# Patient Record
Sex: Male | Born: 1975 | Race: Black or African American | Hispanic: No | Marital: Single | Smoking: Current some day smoker
Health system: Southern US, Community
[De-identification: ages and names within clinical notes are randomized; demographics above are authoritative.]

---

## 2009-08-07 ENCOUNTER — Emergency Department (HOSPITAL_BASED_OUTPATIENT_CLINIC_OR_DEPARTMENT_OTHER): Admission: EM | Admit: 2009-08-07 | Discharge: 2009-08-08 | Payer: Self-pay | Admitting: Emergency Medicine

## 2015-08-17 ENCOUNTER — Emergency Department (HOSPITAL_BASED_OUTPATIENT_CLINIC_OR_DEPARTMENT_OTHER)
Admission: EM | Admit: 2015-08-17 | Discharge: 2015-08-17 | Disposition: A | Discharge status: Home / Self Care | Payer: Managed Care, Other (non HMO) | Attending: Emergency Medicine | Admitting: Emergency Medicine

## 2015-08-17 ENCOUNTER — Encounter (HOSPITAL_BASED_OUTPATIENT_CLINIC_OR_DEPARTMENT_OTHER): Payer: Self-pay

## 2015-08-17 DIAGNOSIS — F17200 Nicotine dependence, unspecified, uncomplicated: Secondary | ICD-10-CM | POA: Insufficient documentation

## 2015-08-17 DIAGNOSIS — B349 Viral infection, unspecified: Secondary | ICD-10-CM | POA: Diagnosis not present

## 2015-08-17 DIAGNOSIS — R52 Pain, unspecified: Secondary | ICD-10-CM | POA: Diagnosis present

## 2015-08-17 MED ORDER — IBUPROFEN 800 MG PO TABS
800.0000 mg | ORAL_TABLET | Freq: Once | ORAL | Status: AC
  Administered 2015-08-17: 800 mg via ORAL
  Filled 2015-08-17: qty 1

## 2015-08-17 NOTE — Discharge Instructions (Signed)
Please read and follow all provided instructions.  Your diagnoses today include:  1. Viral syndrome    You appear to have an upper respiratory infection (URI). An upper respiratory tract infection, or cold, is a viral infection of the air passages leading to the lungs. It should improve gradually after 5-7 days. You may have a lingering cough that lasts for 2- 4 weeks after the infection.  Tests performed today include:  Vital signs. See below for your results today.   Medications prescribed:   Take any prescribed medications only as directed. Treatment for your infection is aimed at treating the symptoms. There are no medications, such as antibiotics, that will cure your infection.   Home care instructions:  Follow any educational materials contained in this packet.   Your illness is contagious and can be spread to others, especially during the first 3 or 4 days. It cannot be cured by antibiotics or other medicines. Take basic precautions such as washing your hands often, covering your mouth when you cough or sneeze, and avoiding public places where you could spread your illness to others.   Please continue drinking plenty of fluids.  Use over-the-counter medicines as needed as directed on packaging for symptom relief.  You may also use ibuprofen or tylenol as directed on packaging for pain or fever.  Do not take multiple medicines containing Tylenol or acetaminophen to avoid taking too much of this medication.  Follow-up instructions: Please follow-up with your primary care provider in the next 3 days for further evaluation of your symptoms if you are not feeling better.   Return instructions:   Please return to the Emergency Department if you experience worsening symptoms.   RETURN IMMEDIATELY IF you develop shortness of breath, confusion or altered mental status, a new rash, become dizzy, faint, or poorly responsive, or are unable to be cared for at home.  Please return if you have  persistent vomiting and cannot keep down fluids or develop a fever that is not controlled by tylenol or motrin.    Please return if you have any other emergent concerns.  Additional Information:  Your vital signs today were: BP 128/72 mmHg   Pulse 98   Temp(Src) 100.9 F (38.3 C) (Oral)   Resp 16   Ht 5\' 11"  (1.803 m)   Wt 90.719 kg   BMI 27.91 kg/m2   SpO2 99% If your blood pressure (BP) was elevated above 135/85 this visit, please have this repeated by your doctor within one month. --------------

## 2015-08-17 NOTE — ED Notes (Signed)
C/o body aches HA since yesterday

## 2015-08-17 NOTE — ED Provider Notes (Signed)
CSN: [id number]     Arrival date & time 08/17/15  1441 History   First MD Initiated Contact with Patient 08/17/15 1721     Chief Complaint  Patient presents with  . Generalized Body Aches   (Consider location/radiation/quality/duration/timing/severity/associated sxs/prior Treatment) HPI 40 y.o. male presents to the Emergency Department today complaining of URI symptoms since yesterday. Associated fever, non productive cough, generalized body aches. No congestion. No CP/SOB/ABD pain. No ear aches. No rhinorrhea. No N/V/D. No pain noted. Positive sick contacts due to son having flu. Tried OTC home remedies with little relief. No other symptoms noted.  History reviewed. No pertinent past medical history. History reviewed. No pertinent past surgical history. No family history on file. Social History  Substance Use Topics  . Smoking status: Current Every Day Smoker  . Smokeless tobacco: None  . Alcohol Use: No    Review of Systems ROS reviewed and all are negative for acute change except as noted in the HPI.  Allergies  Review of patient's allergies indicates no known allergies.  Home Medications   Prior to Admission medications   Not on File   BP 128/72 mmHg  Pulse 98  Temp(Src) 100.9 F (38.3 C) (Oral)  Resp 16  Ht  (1.803 m)  Wt 90.719 kg  BMI 27.91 kg/m2  SpO2 99%   Physical Exam  Constitutional: He is oriented to person, place, and time. He appears well-developed and well-nourished.  HENT:  Head: Normocephalic and atraumatic.  Right Ear: Tympanic membrane, external ear and ear canal normal.  Left Ear: Tympanic membrane, external ear and ear canal normal.  Nose: Nose normal.  Mouth/Throat: Uvula is midline, oropharynx is clear and moist and mucous membranes are normal.  Eyes: EOM are normal.  Neck: Normal range of motion. Neck supple. No tracheal deviation present.  Cardiovascular: Normal rate, regular rhythm and normal heart sounds.   Pulmonary/Chest: Effort  normal and breath sounds normal. No respiratory distress. He has no wheezes. He has no rales. He exhibits no tenderness.  Abdominal: Soft. There is no tenderness.  Musculoskeletal: Normal range of motion.  Neurological: He is alert and oriented to person, place, and time.  Skin: Skin is warm and dry.  Psychiatric: He has a normal mood and affect. His behavior is normal. Thought content normal.  Nursing note and vitals reviewed.  ED Course  Procedures (including critical care time) Labs Review Labs Reviewed - No data to display  Imaging Review No results found. I have personally reviewed and evaluated these images and lab results as part of my medical decision-making.   EKG Interpretation None      MDM  I have reviewed the relevant previous healthcare records. I obtained HPI from historian.  ED Course  Assessment: Pt is a 39yM presents with URI symptoms since yesterday . On exam, pt in NAD. VSS. Temp 100.9. Lungs CTA, Heart RRR. Abdomen nontender/soft. Patients symptoms are consistent with URI, likely viral etiology. Discussed that antibiotics are not indicated for viral infections. Pt will be discharged with symptomatic treatment.  Verbalizes understanding and is agreeable with plan. Pt is hemodynamically stable & in NAD prior to dc.  Disposition/Plan:  DC Home Additional Verbal discharge instructions given and discussed with patient.  Pt Instructed to f/u with PCP in the next week evaluation and treatment of symptoms. Return precautions given Pt acknowledges and agrees with plan  Supervising Physician Laurence Spates, MD   Final diagnoses:  Viral syndrome       Joselyn Glassman  Marlise EvesMohr, PA-C 08/17/15 1736  Laurence Spatesachel Morgan Little, MD 08/20/15 [phone]

## 2018-02-07 ENCOUNTER — Encounter (HOSPITAL_COMMUNITY): Payer: Self-pay

## 2018-02-07 ENCOUNTER — Emergency Department (HOSPITAL_COMMUNITY): Payer: 59

## 2018-02-07 ENCOUNTER — Emergency Department (HOSPITAL_COMMUNITY)
Admission: EM | Admit: 2018-02-07 | Discharge: 2018-02-07 | Disposition: A | Discharge status: Home / Self Care | Payer: 59 | Attending: Emergency Medicine | Admitting: Emergency Medicine

## 2018-02-07 DIAGNOSIS — F17200 Nicotine dependence, unspecified, uncomplicated: Secondary | ICD-10-CM | POA: Insufficient documentation

## 2018-02-07 DIAGNOSIS — R0789 Other chest pain: Secondary | ICD-10-CM | POA: Insufficient documentation

## 2018-02-07 MED ORDER — CYCLOBENZAPRINE HCL 10 MG PO TABS: 10.0000 mg | ORAL_TABLET | Freq: Two times a day (BID) | ORAL | 0 refills | Status: AC | PRN

## 2018-02-07 MED ORDER — NAPROXEN 500 MG PO TABS: 500.0000 mg | ORAL_TABLET | Freq: Two times a day (BID) | ORAL | 0 refills | Status: AC

## 2018-02-07 MED ORDER — ACETAMINOPHEN 325 MG PO TABS
650.0000 mg | ORAL_TABLET | Freq: Once | ORAL | Status: AC
  Administered 2018-02-07: 650 mg via ORAL
  Filled 2018-02-07: qty 2

## 2018-02-07 MED ORDER — OXYCODONE HCL 5 MG PO TABS
5.0000 mg | ORAL_TABLET | Freq: Once | ORAL | Status: AC
  Administered 2018-02-07: 5 mg via ORAL
  Filled 2018-02-07: qty 1
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
## 2018-02-07 NOTE — ED Notes (Signed)
Pt alert and oriented in NAD. Pt verbalized understanding of discharge instructions. 

## 2020-07-04 ENCOUNTER — Encounter (HOSPITAL_BASED_OUTPATIENT_CLINIC_OR_DEPARTMENT_OTHER): Payer: Self-pay

## 2020-07-04 ENCOUNTER — Emergency Department (HOSPITAL_BASED_OUTPATIENT_CLINIC_OR_DEPARTMENT_OTHER): Payer: 59

## 2020-07-04 ENCOUNTER — Other Ambulatory Visit: Payer: Self-pay

## 2020-07-04 ENCOUNTER — Emergency Department (HOSPITAL_BASED_OUTPATIENT_CLINIC_OR_DEPARTMENT_OTHER)
Admission: EM | Admit: 2020-07-04 | Discharge: 2020-07-04 | Disposition: A | Discharge status: Home / Self Care | Payer: 59 | Attending: Emergency Medicine | Admitting: Emergency Medicine

## 2020-07-04 DIAGNOSIS — U071 COVID-19: Secondary | ICD-10-CM | POA: Diagnosis not present

## 2020-07-04 DIAGNOSIS — R0602 Shortness of breath: Secondary | ICD-10-CM | POA: Diagnosis present

## 2020-07-04 DIAGNOSIS — F17210 Nicotine dependence, cigarettes, uncomplicated: Secondary | ICD-10-CM | POA: Diagnosis not present

## 2020-07-04 MED ORDER — BENZONATATE 100 MG PO CAPS
100.0000 mg | ORAL_CAPSULE | Freq: Once | ORAL | Status: AC
  Administered 2020-07-04: 100 mg via ORAL
  Filled 2020-07-04: qty 1

## 2020-07-04 MED ORDER — BENZONATATE 100 MG PO CAPS: 100.0000 mg | ORAL_CAPSULE | Freq: Three times a day (TID) | ORAL | 0 refills | Status: AC

## 2020-07-04 MED ORDER — ONDANSETRON 4 MG PO TBDP: ORAL_TABLET | ORAL | 0 refills | Status: AC

## 2020-07-04 MED ORDER — ONDANSETRON 4 MG PO TBDP
4.0000 mg | ORAL_TABLET | Freq: Once | ORAL | Status: AC
Start: 2020-07-04 — End: 2020-07-04
  Administered 2020-07-04: 4 mg via ORAL
  Filled 2020-07-04: qty 1

## 2020-07-04 NOTE — Discharge Instructions (Addendum)
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

## 2020-07-04 NOTE — ED Notes (Signed)
Ambulatory to treatment room without increase in work of breathing, speaking in full sentences without difficulty.

## 2020-07-04 NOTE — ED Provider Notes (Signed)
[facility] EMERGENCY DEPARTMENT Provider Note   CSN: [id number] Arrival date & time: 07/04/20  1548     History Chief Complaint  Patient presents with  . Shortness of Breath    COVID +    Samuel Harper is a 45 y.o. male who presented with persistent shortness of breath after COVID.  Patient was diagnosed with COVID on January 14.  Patient went to urgent care on the 19th and had persistent positive COVID test.  Patient has not been going back to work and has persistent cough.  Patient denies any fevers.  Patient states that he has poor appetite.  He states that he is done with his isolation today but he is still not feeling well.  Patient did not receive the COVID-vaccines.  Patient is otherwise healthy.  The history is provided by the patient.       History reviewed. No pertinent past medical history.  There are no problems to display for this patient.   History reviewed. No pertinent surgical history.     History reviewed. No pertinent family history.  Social History   Tobacco Use  . Smoking status: Current Some Day Smoker    Types: Cigarettes  . Smokeless tobacco: Never Used  Substance Use Topics  . Alcohol use: Yes    Comment: socailly  . Drug use: Yes    Types: Marijuana    Comment: occassional    Home Medications Prior to Admission medications   Medication Sig Start Date End Date Taking? Authorizing Provider  cyclobenzaprine (FLEXERIL) 10 MG tablet Take 1 tablet (10 mg total) by mouth 2 (two) times daily as needed for muscle spasms. 02/07/18   Sabas Sous, MD  naproxen (NAPROSYN) 500 MG tablet Take 1 tablet (500 mg total) by mouth 2 (two) times daily. 02/07/18   Sabas Sous, MD    Allergies    Patient has no known allergies.  Review of Systems   Review of Systems  Respiratory: Positive for cough.   Gastrointestinal: Positive for nausea.  All other systems reviewed and are negative.   Physical Exam Updated Vital Signs BP 96/69 (BP  Location: Right Arm)   Pulse 72   Temp 98.2 F (36.8 C) (Oral)   Resp 15   Ht 5\' 11"  (1.803 m)   Wt 89.4 kg   SpO2 97%   BMI 27.48 kg/m   Physical Exam Vitals and nursing note reviewed.  Constitutional:      Comments: Well-appearing overall  HENT:     Head: Normocephalic.     Mouth/Throat:     Mouth: Mucous membranes are moist.  Eyes:     Extraocular Movements: Extraocular movements intact.     Pupils: Pupils are equal, round, and reactive to light.  Cardiovascular:     Rate and Rhythm: Normal rate and regular rhythm.  Pulmonary:     Effort: Pulmonary effort is normal.     Breath sounds: Normal breath sounds.  Abdominal:     General: Bowel sounds are normal.     Palpations: Abdomen is soft.  Musculoskeletal:        General: Normal range of motion.     Cervical back: Normal range of motion and neck supple.  Skin:    General: Skin is warm.     Capillary Refill: Capillary refill takes less than 2 seconds.  Neurological:     General: No focal deficit present.     Mental Status: He is disoriented.  Psychiatric:  Mood and Affect: Mood normal.        Behavior: Behavior normal.     ED Results / Procedures / Treatments   Labs (all labs ordered are listed, but only abnormal results are displayed) Labs Reviewed - No data to display  EKG EKG Interpretation  Date/Time:  Sunday July 04 2020 16:05:56 EST Ventricular Rate:  78 PR Interval:  152 QRS Duration: 70 QT Interval:  316 QTC Calculation: 360 R Axis:   70 Text Interpretation: Normal sinus rhythm Nonspecific ST and T wave abnormality Abnormal ECG No previous ECGs available Confirmed by Richardean Canal [phone]) on 07/04/2020 7:40:20 PM   Radiology DG Chest Portable 1 View  Result Date: 07/04/2020 CLINICAL DATA:  Shortness of breath. COVID-19 positive on 06/26/2019. EXAM: PORTABLE CHEST 1 VIEW COMPARISON:  02/07/2018 FINDINGS: Midline trachea. Normal heart size and mediastinal contours. No pleural effusion or  pneumothorax. Clear lungs. IMPRESSION: No acute cardiopulmonary disease. Electronically Signed   By: Jeronimo Greaves M.D.   On: 07/04/2020 16:31    Procedures Procedures (including critical care time)  Medications Ordered in ED Medications - No data to display  ED Course  I have reviewed the triage vital signs and the nursing notes.  Pertinent labs & imaging results that were available during my care of the patient were reviewed by me and considered in my medical decision making (see chart for details).    MDM Rules/Calculators/A&P                         Samuel Harper is a 45 y.o. male here presenting with nausea and cough and persistent shortness of breath.  Patient is not hypoxic.  He is also not tachycardic and vitals are stable.  Patient was diagnosed with COVID about 10 days ago.  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  I do not think he has a PE right now.  We will give Zofran for nausea and Tessalon Perles for cough.  Will refer to post-COVID clinic.   Final Clinical Impression(s) / ED Diagnoses Final diagnoses:  None    Rx / DC Orders ED Discharge Orders    None       Charlynne Pander, MD 07/04/20 [phone]

## 2020-07-04 NOTE — ED Triage Notes (Addendum)
Pt tested positive for COVID on 1/14. States he has had cough and dyspnea on exertion and intermittent midsternal chest tightness. NAD during triage, SpO2 100%

## 2020-07-08 ENCOUNTER — Telehealth: Payer: Self-pay

## 2020-07-08 NOTE — Telephone Encounter (Signed)
[facility] ([phone]) called pt for HFU. LVM for return call to schedule HFU appt. 

## 2020-07-09 NOTE — Telephone Encounter (Signed)
2nd call to pt for HFU appt at [facility]. LVM.

## 2021-06-07 IMAGING — DX DG CHEST 1V PORT
1 series · 1 of 1 positions shown · non-contrast
Comparison: 02/07/2018

CLINICAL DATA: Shortness of breath. QPIEN-ZM positive on
06/26/2019.

EXAM:
PORTABLE CHEST 1 VIEW

[chest ap]
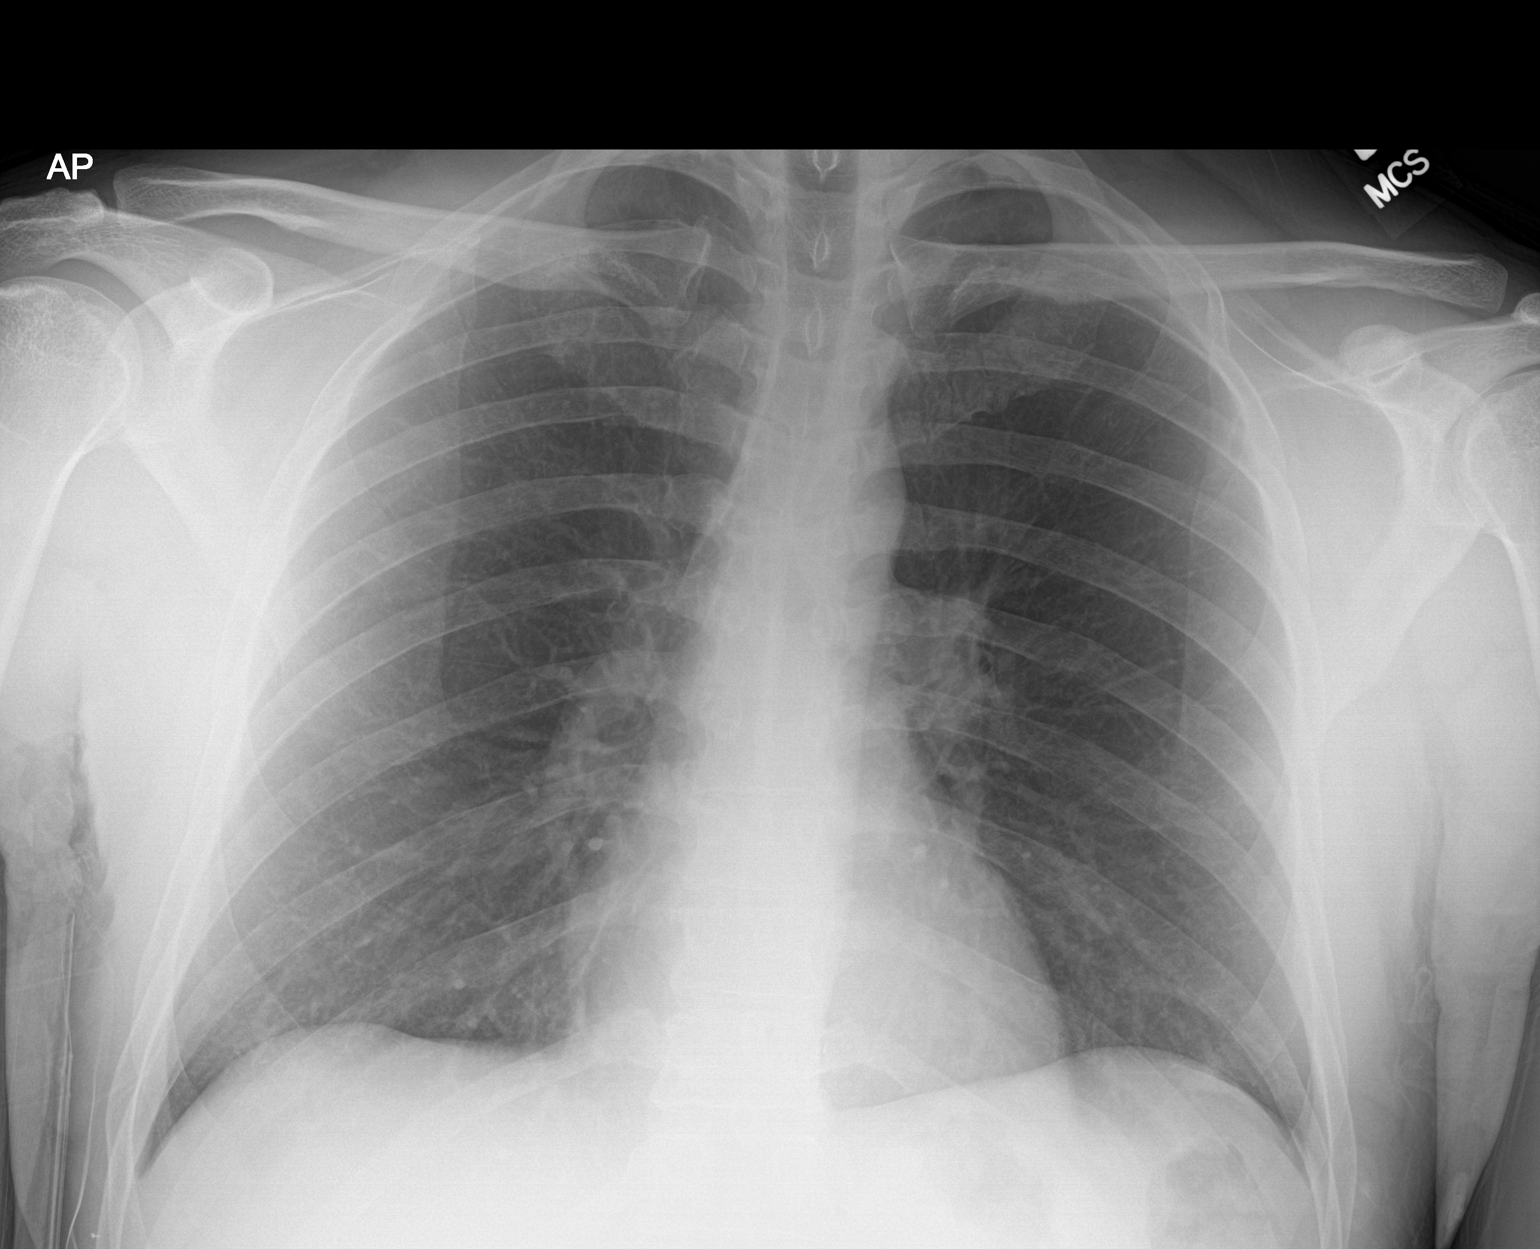

[1 of 1 positions shown; findings below may reference images not displayed]

FINDINGS: Midline trachea. Normal heart size and mediastinal contours. No
pleural effusion or pneumothorax. Clear lungs.
IMPRESSION: No acute cardiopulmonary disease.

## 2022-05-09 ENCOUNTER — Emergency Department (HOSPITAL_BASED_OUTPATIENT_CLINIC_OR_DEPARTMENT_OTHER)
Admission: EM | Admit: 2022-05-09 | Discharge: 2022-05-09 | Disposition: A | Discharge status: Home / Self Care | Payer: PRIVATE HEALTH INSURANCE | Attending: Emergency Medicine | Admitting: Emergency Medicine

## 2022-05-09 ENCOUNTER — Other Ambulatory Visit: Payer: Self-pay

## 2022-05-09 DIAGNOSIS — K0889 Other specified disorders of teeth and supporting structures: Secondary | ICD-10-CM | POA: Insufficient documentation

## 2022-05-09 MED ORDER — OXYCODONE-ACETAMINOPHEN 5-325 MG PO TABS
2.0000 | ORAL_TABLET | Freq: Once | ORAL | Status: AC
  Administered 2022-05-09: 2 via ORAL
  Filled 2022-05-09: qty 2

## 2022-05-09 MED ORDER — PENICILLIN V POTASSIUM 500 MG PO TABS: 500.0000 mg | ORAL_TABLET | Freq: Four times a day (QID) | ORAL | 0 refills | Status: AC

## 2022-05-09 MED ORDER — MELOXICAM 15 MG PO TABS: 15.0000 mg | ORAL_TABLET | Freq: Every day | ORAL | 0 refills | Status: AC

## 2022-05-09 MED ORDER — KETOROLAC TROMETHAMINE 60 MG/2ML IM SOLN
30.0000 mg | Freq: Once | INTRAMUSCULAR | Status: AC
  Administered 2022-05-09: 30 mg via INTRAMUSCULAR
  Filled 2022-05-09: qty 2

## 2022-05-09 MED ORDER — PENICILLIN V POTASSIUM 250 MG PO TABS
500.0000 mg | ORAL_TABLET | Freq: Once | ORAL | Status: AC
Start: 2022-05-09 — End: 2022-05-09
  Administered 2022-05-09: 500 mg via ORAL
  Filled 2022-05-09: qty 2

## 2022-05-09 NOTE — ED Triage Notes (Signed)
Left dental/jaw pain since yesterday, attempted to call 24 hr dentist but unable to get one.

## 2022-05-09 NOTE — ED Provider Notes (Signed)
[facility] EMERGENCY DEPARTMENT Provider Note   CSN: [id number] Arrival date & time: 05/09/22  0414     History  Chief Complaint  Patient presents with   Dental Pain    Samuel Harper is a 46 y.o. male.  Left lower first molar pain for about the last 24 hours.  Comes and goes based on Orajel and Listerine but tonight it has been pretty severe keeping him awake.  Tried to get in with the dentist but no one is answering the phone.  He plans on seeing a dentist later today.  He had root canals and cavities before to feel similar to those episodes.  No trauma.  No ear pain.  No trouble swallowing.   Dental Pain      Home Medications Prior to Admission medications   Medication Sig Start Date End Date Taking? Authorizing Provider  meloxicam (MOBIC) 15 MG tablet Take 1 tablet (15 mg total) by mouth daily for 10 days. 05/09/22 05/19/22 Yes Jaeshaun Riva, Barbara Cower, MD  penicillin v potassium (VEETID) 500 MG tablet Take 1 tablet (500 mg total) by mouth 4 (four) times daily for 10 days. 05/09/22 05/19/22 Yes Lynnda Wiersma, Barbara Cower, MD  benzonatate (TESSALON) 100 MG capsule Take 1 capsule (100 mg total) by mouth every 8 (eight) hours. 07/04/20   Charlynne Pander, MD  cyclobenzaprine (FLEXERIL) 10 MG tablet Take 1 tablet (10 mg total) by mouth 2 (two) times daily as needed for muscle spasms. 02/07/18   Sabas Sous, MD  naproxen (NAPROSYN) 500 MG tablet Take 1 tablet (500 mg total) by mouth 2 (two) times daily. 02/07/18   Sabas Sous, MD  ondansetron (ZOFRAN ODT) 4 MG disintegrating tablet 4mg  ODT q4 hours prn nausea/vomit 07/04/20   07/06/20, MD      Allergies    Patient has no known allergies.    Review of Systems   Review of Systems  Physical Exam Updated Vital Signs BP 135/88 (BP Location: Right Arm)   Pulse 64   Temp 98.3 F (36.8 C) (Oral)   Resp 20   Ht 5\' 11"  (1.803 m)   Wt 93.9 kg   SpO2 99%   BMI 28.87 kg/m  Physical Exam Vitals and nursing note reviewed.   Constitutional:      Appearance: He is well-developed.  HENT:     Head: Normocephalic and atraumatic.     Comments: Has some generalized gingivitis and leukoplakia but no abscesses, evidence of deep neck space infections. Cardiovascular:     Rate and Rhythm: Normal rate.  Pulmonary:     Effort: Pulmonary effort is normal. No respiratory distress.  Abdominal:     General: There is no distension.  Musculoskeletal:        General: Normal range of motion.     Cervical back: Normal range of motion.  Neurological:     Mental Status: He is alert.     ED Results / Procedures / Treatments   Labs (all labs ordered are listed, but only abnormal results are displayed) Labs Reviewed - No data to display  EKG None  Radiology No results found.  Procedures Procedures    Medications Ordered in ED Medications  ketorolac (TORADOL) injection 30 mg (has no administration in time range)  oxyCODONE-acetaminophen (PERCOCET/ROXICET) 5-325 MG per tablet 2 tablet (has no administration in time range)  penicillin v potassium (VEETID) tablet 500 mg (has no administration in time range)    ED Course/ Medical Decision Making/ A&P  Medical Decision Making Risk Prescription drug management.   Uncomplicated dental pain.  Antibiotics/analgesics provided here.  We will follow-up with dentist later today.  Final Clinical Impression(s) / ED Diagnoses Final diagnoses:  Pain, dental    Rx / DC Orders ED Discharge Orders          Ordered    meloxicam (MOBIC) 15 MG tablet  Daily        05/09/22 0447    penicillin v potassium (VEETID) 500 MG tablet  4 times daily        05/09/22 0447              Osie Merkin, Barbara Cower, MD 05/09/22 [phone]
